# Patient Record
Sex: Female | Born: 2000 | Race: Black or African American | Hispanic: No | Marital: Single | Smoking: Never smoker
Health system: Southern US, Community
[De-identification: ages and names within clinical notes are randomized; demographics above are authoritative.]

---
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
## 2022-01-18 DIAGNOSIS — Z419 Encounter for procedure for purposes other than remedying health state, unspecified: Secondary | ICD-10-CM | POA: Diagnosis not present

## 2022-02-03 IMAGING — DX DG FOOT COMPLETE 3+V*L*
3 series · 3 of 3 positions shown · non-contrast
Comparison: 04/17/2018

CLINICAL DATA: Fell [REDACTED], caught LEFT foot and ankle
underneath unknown object in garage, having pain LEFT foot and ankle

EXAM:
LEFT FOOT - COMPLETE 3+ VIEW

[foot ap]
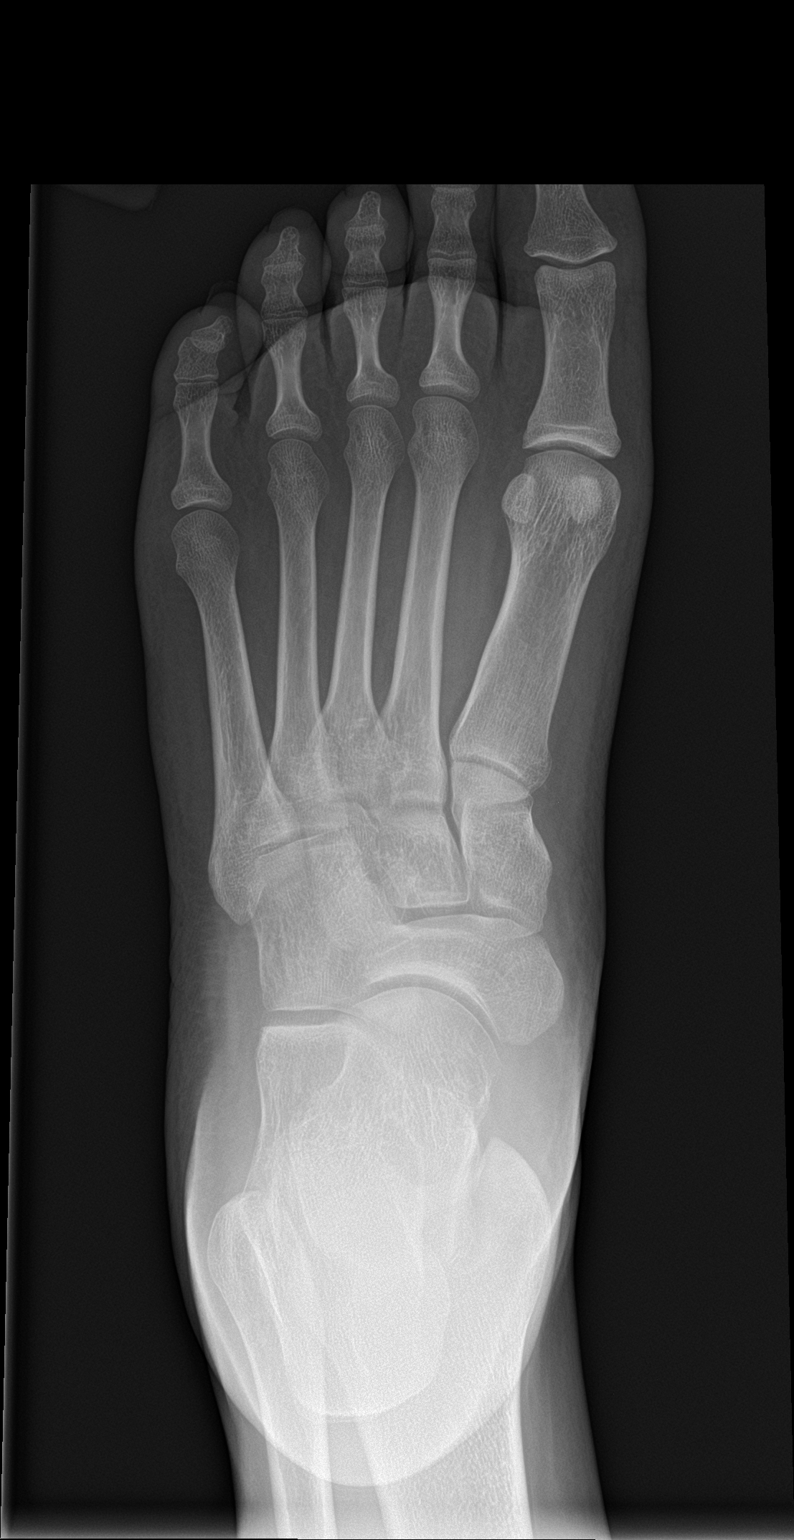

[foot obl]
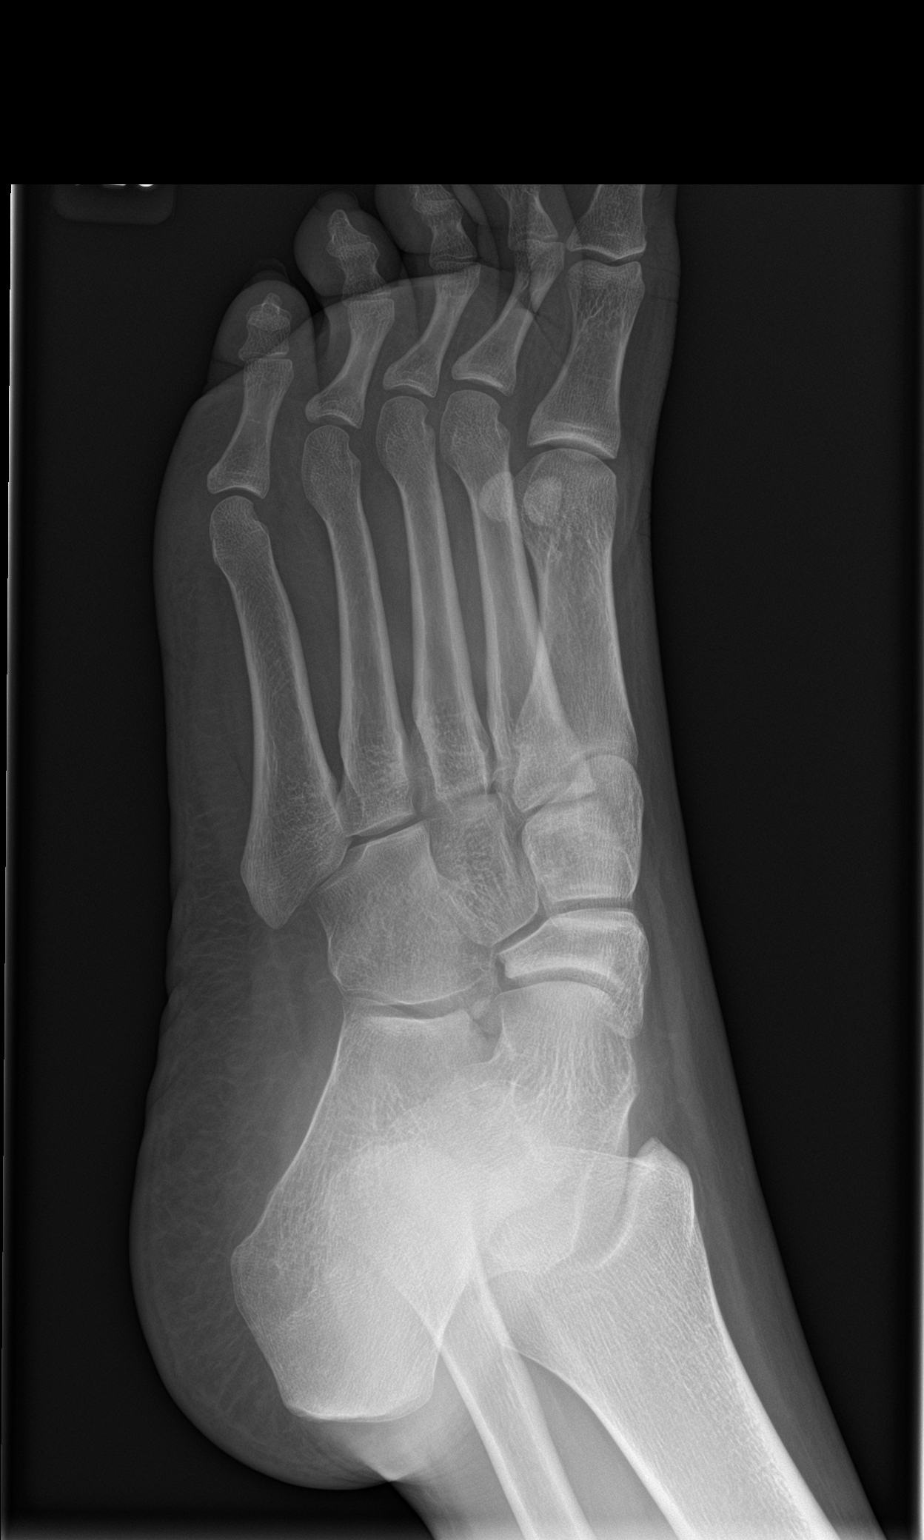

[ankle lat]
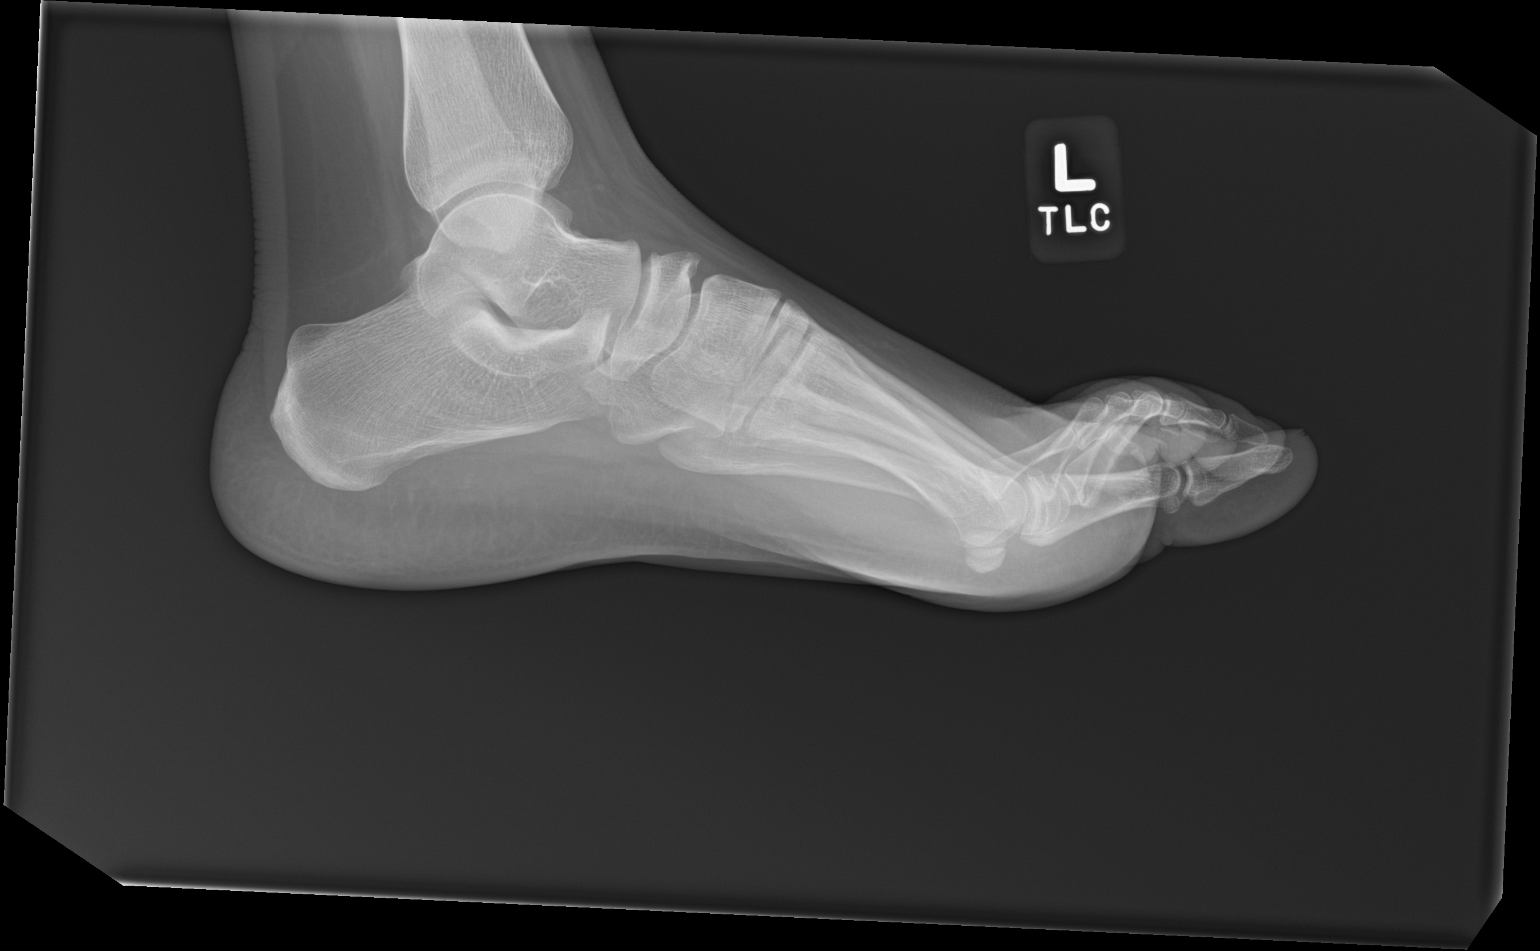

[3 of 3 positions shown; findings below may reference images not displayed]

FINDINGS: Osseous mineralization normal.

Joint spaces preserved.

No fracture, dislocation, or bone destruction.
IMPRESSION: Normal exam.

## 2022-02-03 IMAGING — DX DG ANKLE COMPLETE 3+V*L*
3 series · 3 of 3 positions shown · non-contrast
Comparison: 04/17/2018

CLINICAL DATA: Fell [REDACTED], caught LEFT foot and ankle
underneath unknown object in garage, having pain LEFT foot and ankle

EXAM:
LEFT ANKLE COMPLETE - 3+ VIEW

[ankle ap]
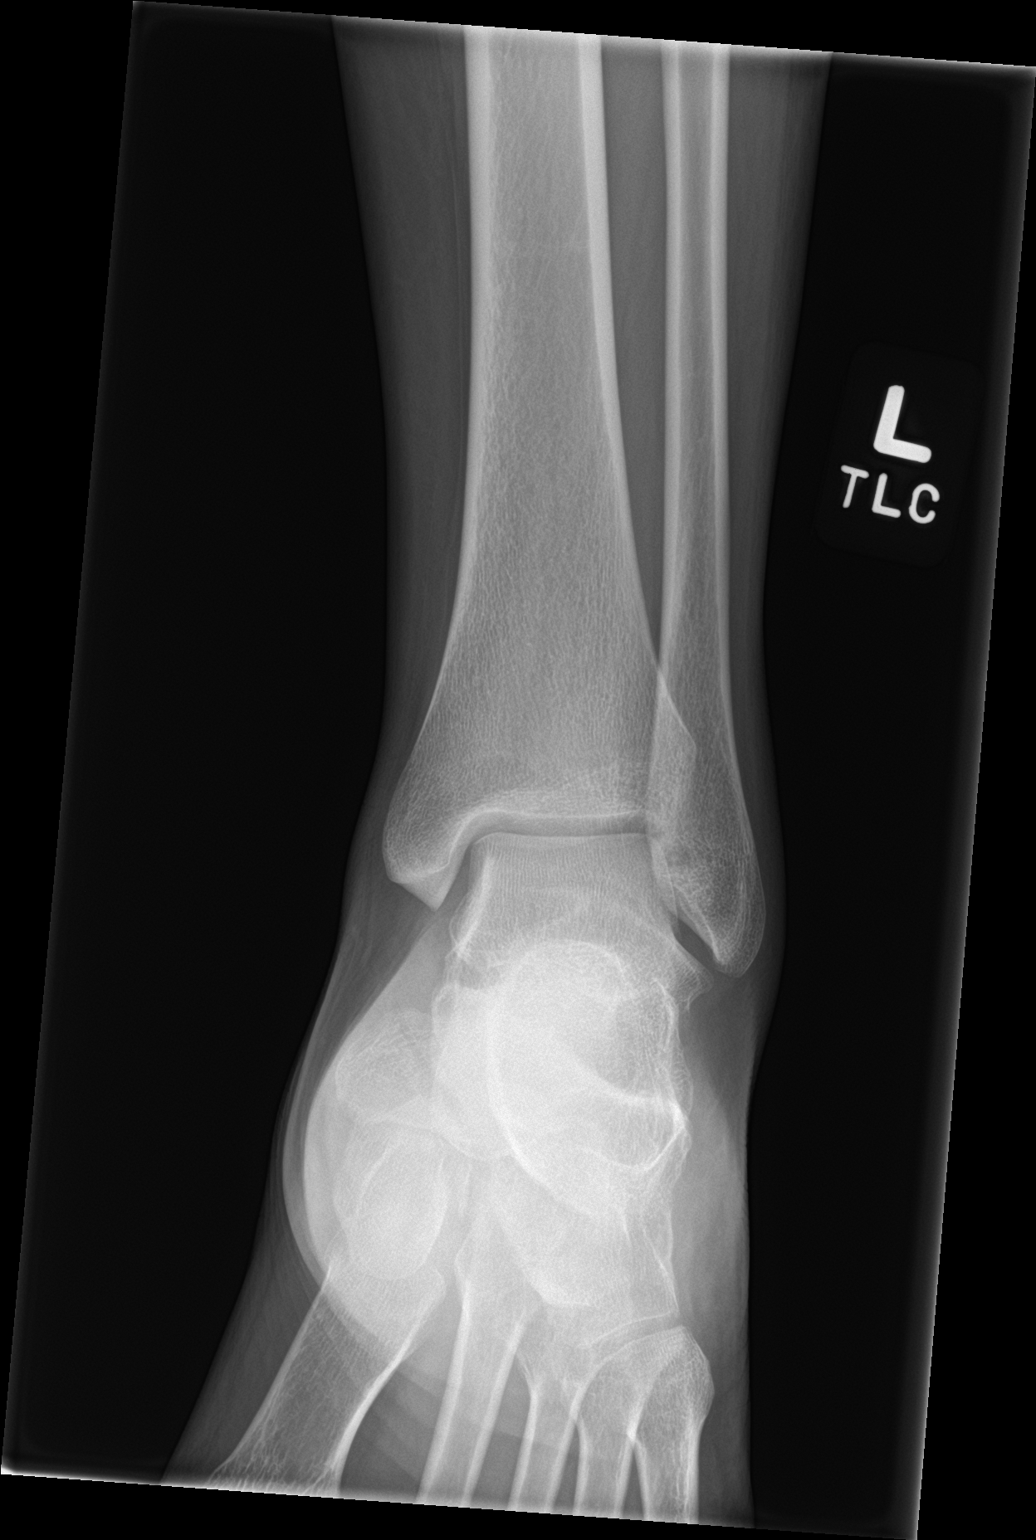

[ankle obl]
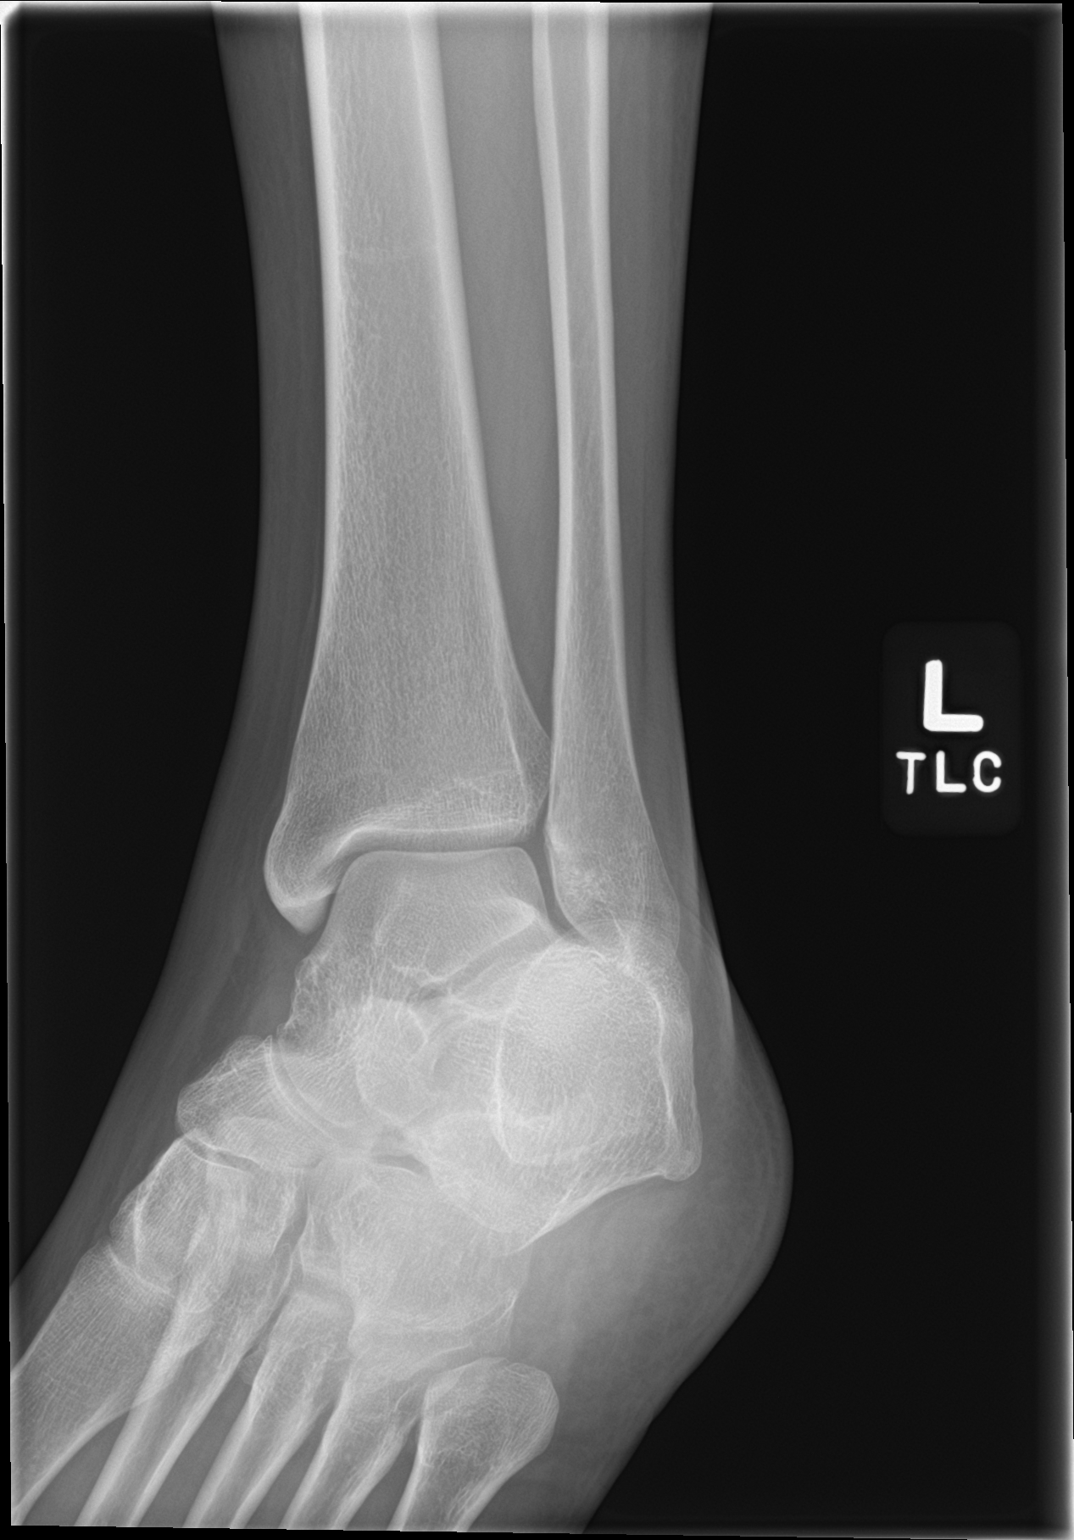

[ankle lat]
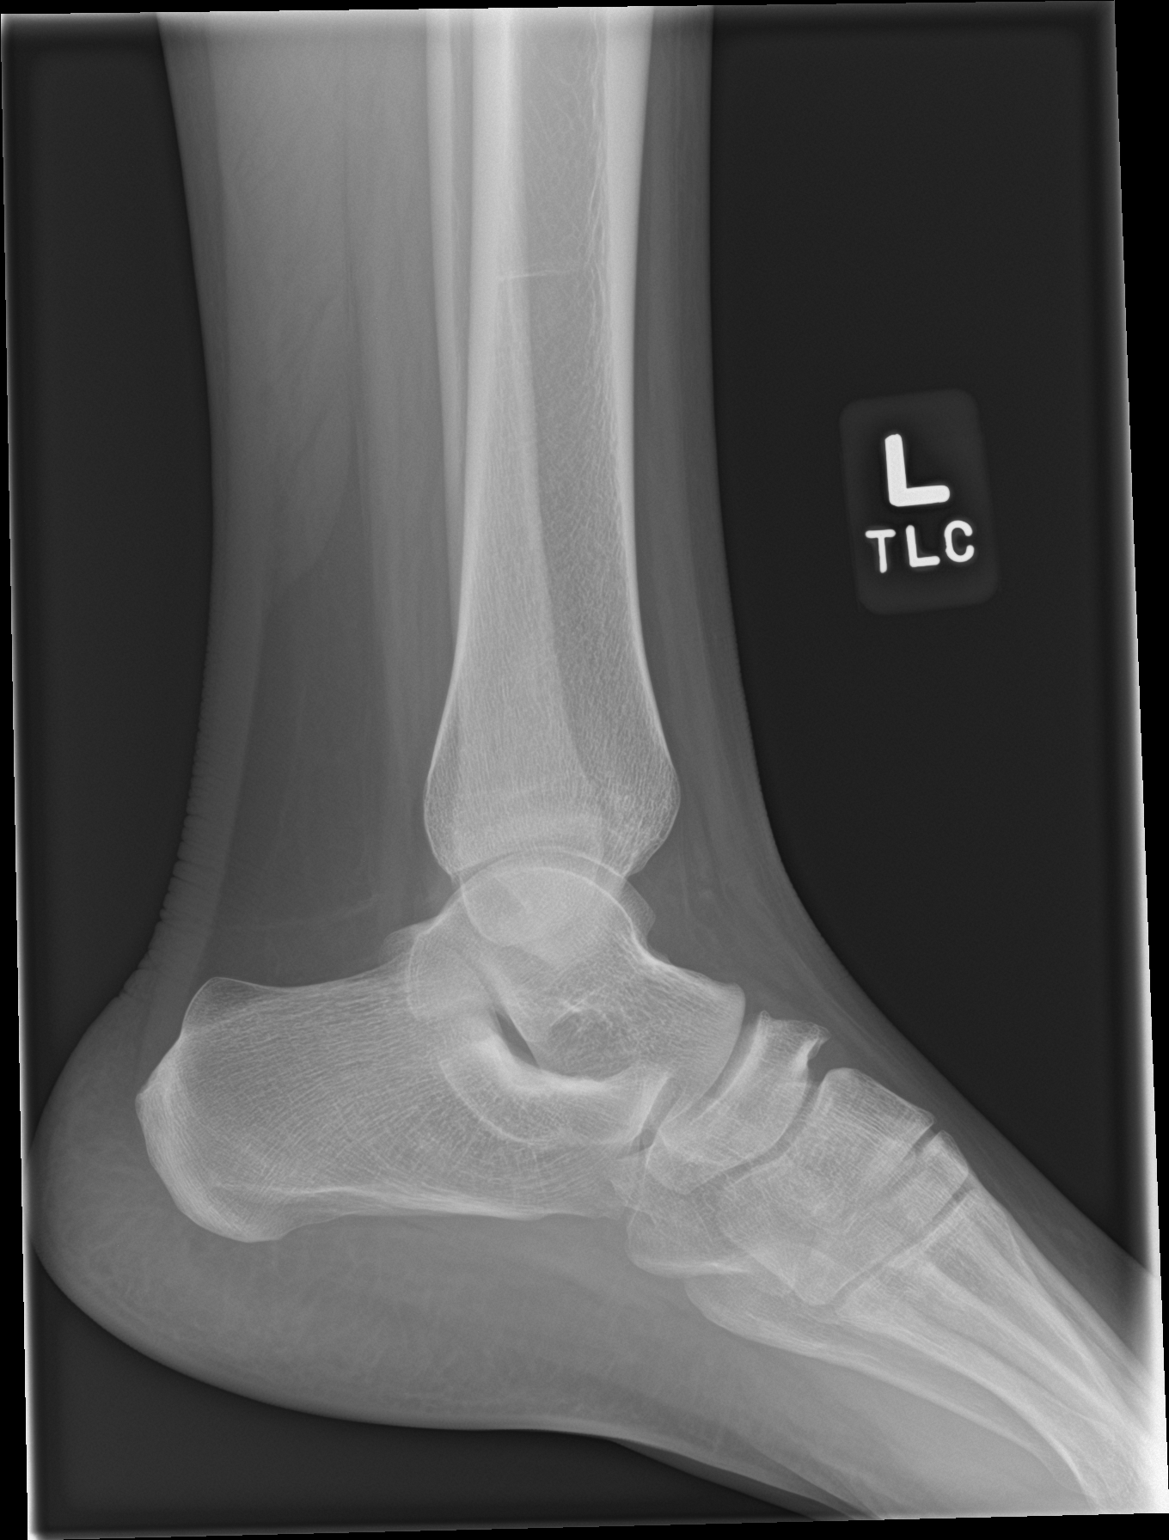

[3 of 3 positions shown; findings below may reference images not displayed]

FINDINGS: Osseous mineralization normal.

Joint spaces preserved.

No fracture, dislocation, or bone destruction.
IMPRESSION: Normal exam.

## 2022-02-17 DIAGNOSIS — Z419 Encounter for procedure for purposes other than remedying health state, unspecified: Secondary | ICD-10-CM | POA: Diagnosis not present

## 2022-03-20 DIAGNOSIS — Z419 Encounter for procedure for purposes other than remedying health state, unspecified: Secondary | ICD-10-CM | POA: Diagnosis not present

## 2022-03-23 DIAGNOSIS — S93601A Unspecified sprain of right foot, initial encounter: Secondary | ICD-10-CM | POA: Diagnosis not present

## 2022-03-23 DIAGNOSIS — X500XXA Overexertion from strenuous movement or load, initial encounter: Secondary | ICD-10-CM | POA: Diagnosis not present

## 2022-03-23 DIAGNOSIS — S93491A Sprain of other ligament of right ankle, initial encounter: Secondary | ICD-10-CM | POA: Diagnosis not present

## 2022-03-23 DIAGNOSIS — J029 Acute pharyngitis, unspecified: Secondary | ICD-10-CM | POA: Diagnosis not present

## 2022-04-19 DIAGNOSIS — Z419 Encounter for procedure for purposes other than remedying health state, unspecified: Secondary | ICD-10-CM | POA: Diagnosis not present

## 2022-05-20 DIAGNOSIS — Z419 Encounter for procedure for purposes other than remedying health state, unspecified: Secondary | ICD-10-CM | POA: Diagnosis not present

## 2022-06-20 DIAGNOSIS — Z419 Encounter for procedure for purposes other than remedying health state, unspecified: Secondary | ICD-10-CM | POA: Diagnosis not present

## 2022-07-20 DIAGNOSIS — Z419 Encounter for procedure for purposes other than remedying health state, unspecified: Secondary | ICD-10-CM | POA: Diagnosis not present

## 2022-08-20 DIAGNOSIS — Z419 Encounter for procedure for purposes other than remedying health state, unspecified: Secondary | ICD-10-CM | POA: Diagnosis not present

## 2022-09-19 DIAGNOSIS — Z419 Encounter for procedure for purposes other than remedying health state, unspecified: Secondary | ICD-10-CM | POA: Diagnosis not present

## 2022-10-20 DIAGNOSIS — Z419 Encounter for procedure for purposes other than remedying health state, unspecified: Secondary | ICD-10-CM | POA: Diagnosis not present

## 2022-11-20 DIAGNOSIS — Z419 Encounter for procedure for purposes other than remedying health state, unspecified: Secondary | ICD-10-CM | POA: Diagnosis not present

## 2022-11-29 ENCOUNTER — Other Ambulatory Visit: Payer: Self-pay

## 2022-11-29 ENCOUNTER — Emergency Department (HOSPITAL_BASED_OUTPATIENT_CLINIC_OR_DEPARTMENT_OTHER): Payer: Medicaid Other

## 2022-11-29 ENCOUNTER — Emergency Department (HOSPITAL_BASED_OUTPATIENT_CLINIC_OR_DEPARTMENT_OTHER)
Admission: EM | Admit: 2022-11-29 | Discharge: 2022-11-29 | Disposition: A | Discharge status: Home / Self Care | Payer: Medicaid Other | Attending: Emergency Medicine | Admitting: Emergency Medicine

## 2022-11-29 DIAGNOSIS — D72829 Elevated white blood cell count, unspecified: Secondary | ICD-10-CM | POA: Diagnosis not present

## 2022-11-29 DIAGNOSIS — Y9301 Activity, walking, marching and hiking: Secondary | ICD-10-CM | POA: Diagnosis not present

## 2022-11-29 DIAGNOSIS — Z743 Need for continuous supervision: Secondary | ICD-10-CM | POA: Diagnosis not present

## 2022-11-29 DIAGNOSIS — E860 Dehydration: Secondary | ICD-10-CM

## 2022-11-29 DIAGNOSIS — R55 Syncope and collapse: Secondary | ICD-10-CM

## 2022-11-29 DIAGNOSIS — W01198A Fall on same level from slipping, tripping and stumbling with subsequent striking against other object, initial encounter: Secondary | ICD-10-CM | POA: Diagnosis not present

## 2022-11-29 DIAGNOSIS — G4489 Other headache syndrome: Secondary | ICD-10-CM | POA: Diagnosis not present

## 2022-11-29 DIAGNOSIS — R531 Weakness: Secondary | ICD-10-CM | POA: Diagnosis not present

## 2022-11-29 DIAGNOSIS — R0902 Hypoxemia: Secondary | ICD-10-CM | POA: Diagnosis not present

## 2022-11-29 DIAGNOSIS — S0990XA Unspecified injury of head, initial encounter: Secondary | ICD-10-CM

## 2022-11-29 LAB — URINALYSIS, ROUTINE W REFLEX MICROSCOPIC
Bacteria, UA: NONE SEEN
Bilirubin Urine: NEGATIVE
Glucose, UA: NEGATIVE mg/dL
Hgb urine dipstick: NEGATIVE
Ketones, ur: NEGATIVE mg/dL
Nitrite: NEGATIVE
Specific Gravity, Urine: 1.022 (ref 1.005–1.030)
WBC, UA: >50 WBC/hpf (ref 0–5)
pH: 6 (ref 5.0–8.0)

## 2022-11-29 LAB — CBC
HCT: 39.4 % (ref 36.0–46.0)
Hemoglobin: 12.1 g/dL (ref 12.0–15.0)
MCH: 21.6 pg — ABNORMAL LOW (ref 26.0–34.0)
MCHC: 30.7 g/dL (ref 30.0–36.0)
MCV: 70.4 fL — ABNORMAL LOW (ref 80.0–100.0)
Platelets: 412 10*3/uL — ABNORMAL HIGH (ref 150–400)
RBC: 5.6 MIL/uL — ABNORMAL HIGH (ref 3.87–5.11)
RDW: 16 % — ABNORMAL HIGH (ref 11.5–15.5)
WBC: 18.9 10*3/uL — ABNORMAL HIGH (ref 4.0–10.5)
nRBC: 0 % (ref 0.0–0.2)

## 2022-11-29 LAB — BASIC METABOLIC PANEL
Anion gap: 9 (ref 5–15)
BUN: 8 mg/dL (ref 6–20)
CO2: 23 mmol/L (ref 22–32)
Calcium: 8.7 mg/dL — ABNORMAL LOW (ref 8.9–10.3)
Chloride: 107 mmol/L (ref 98–111)
Creatinine, Ser: 0.8 mg/dL (ref 0.44–1.00)
GFR, Estimated: >60 mL/min (ref >60)
Glucose, Bld: 105 mg/dL — ABNORMAL HIGH (ref 70–99)
Potassium: 3.6 mmol/L (ref 3.5–5.1)
Sodium: 139 mmol/L (ref 135–145)

## 2022-11-29 LAB — PREGNANCY, URINE: Preg Test, Ur: NEGATIVE

## 2022-11-29 LAB — CBG MONITORING, ED: Glucose-Capillary: 123 mg/dL — ABNORMAL HIGH (ref 70–99)

## 2022-11-29 MED ORDER — SODIUM CHLORIDE 0.9 % IV BOLUS
1000.0000 mL | Freq: Once | INTRAVENOUS | Status: AC
  Administered 2022-11-29: 1000 mL via INTRAVENOUS

## 2022-11-29 MED ORDER — SODIUM CHLORIDE 0.9 % IV SOLN: 1.0000 g | Freq: Once | INTRAVENOUS | Status: DC

## 2022-11-29 MED ORDER — ACETAMINOPHEN 325 MG PO TABS
650.0000 mg | ORAL_TABLET | Freq: Once | ORAL | Status: AC
  Administered 2022-11-29: 650 mg via ORAL
  Filled 2022-11-29: qty 2

## 2022-11-29 NOTE — ED Provider Notes (Signed)
[place] Provider Note   CSN: [id number] Arrival date & time: 11/29/22  1620     History  Chief Complaint  Patient presents with   Loss of Consciousness    Kristin Calderon is a 22 y.o. female with noncontributory past medical history who presents with concern for syncopal episode 20 minutes after donating plasma.  Patient reports that she was walking on the street shortly after donating, started to feel hot, dizzy.  Patient reports that she did hit her head on the right frontal region.  She reports no significant ongoing headache, no numbness, tingling, vision changes.  Patient denies any recent blood loss other than plasma donation.  She denies any dysuria, urinary frequency, chest pain, shortness of breath, headache prior to syncope.  She reports no prior episodes of similar.  She denies any history of early cardiac death in her family.  Loss of Consciousness      Home Medications Prior to Admission medications   Medication Sig Start Date End Date Taking? Authorizing Provider  ondansetron (ZOFRAN) 4 MG tablet Take 1 tablet (4 mg total) by mouth every 8 (eight) hours as needed for nausea or vomiting. 02/03/17   Archer Asa, NP      Allergies    Patient has no known allergies.    Review of Systems   Review of Systems  Cardiovascular:  Positive for syncope.  All other systems reviewed and are negative.   Physical Exam Updated Vital Signs BP 120/74 (BP Location: Left Arm)   Pulse 95   Temp 98.7 F (37.1 C) (Oral)   Resp 18   Ht 5' 10"$  (1.778 m)   Wt 108.9 kg   SpO2 100%   BMI 34.44 kg/m  Physical Exam Vitals and nursing note reviewed.  Constitutional:      General: She is not in acute distress.    Appearance: Normal appearance.  HENT:     Head: Normocephalic and atraumatic.  Eyes:     General:        Right eye: No discharge.        Left eye: No discharge.  Cardiovascular:     Rate and Rhythm: Normal rate  and regular rhythm.     Heart sounds: No murmur heard.    No friction rub. No gallop.  Pulmonary:     Effort: Pulmonary effort is normal.     Breath sounds: Normal breath sounds.  Abdominal:     General: Bowel sounds are normal.     Palpations: Abdomen is soft.  Skin:    General: Skin is warm and dry.     Capillary Refill: Capillary refill takes less than 2 seconds.     Comments: Patient with no significant abrasion, excoriation, or laceration on the right frontal skull where she struck the ground  Neurological:     Mental Status: She is alert and oriented to person, place, and time.     Comments: Cranial nerves II through XII grossly intact.  Intact finger-nose, intact heel-to-shin.  Romberg negative, gait normal.  Alert and oriented x3.  Moves all 4 limbs spontaneously, normal coordination.  No pronator drift.  Intact strength 5 out of 5 bilateral upper and lower extremities.  Mild sensitivity to light on evaluation pupils, but normal pupillary response  Psychiatric:        Mood and Affect: Mood normal.        Behavior: Behavior normal.     ED Results / Procedures /  Treatments   Labs (all labs ordered are listed, but only abnormal results are displayed) Labs Reviewed  BASIC METABOLIC PANEL - Abnormal; Notable for the following components:      Result Value   Glucose, Bld 105 (*)    Calcium 8.7 (*)    All other components within normal limits  CBC - Abnormal; Notable for the following components:   WBC 18.9 (*)    RBC 5.60 (*)    MCV 70.4 (*)    MCH 21.6 (*)    RDW 16.0 (*)    Platelets 412 (*)    All other components within normal limits  URINALYSIS, ROUTINE W REFLEX MICROSCOPIC - Abnormal; Notable for the following components:   APPearance HAZY (*)    Protein, ur TRACE (*)    Leukocytes,Ua LARGE (*)    All other components within normal limits  CBG MONITORING, ED - Abnormal; Notable for the following components:   Glucose-Capillary 123 (*)    All other components  within normal limits  PREGNANCY, URINE    EKG None  Radiology CT Head Wo Contrast  Result Date: 11/29/2022 CLINICAL DATA:  Head trauma, moderate-severe EXAM: CT HEAD WITHOUT CONTRAST TECHNIQUE: Contiguous axial images were obtained from the base of the skull through the vertex without intravenous contrast. RADIATION DOSE REDUCTION: This exam was performed according to the departmental dose-optimization program which includes automated exposure control, adjustment of the mA and/or kV according to patient size and/or use of iterative reconstruction technique. COMPARISON:  None Available. FINDINGS: Brain: No evidence of large-territorial acute infarction. No parenchymal hemorrhage. No mass lesion. No extra-axial collection. No mass effect or midline shift. No hydrocephalus. Basilar cisterns are patent. Vascular: No hyperdense vessel. Skull: No acute fracture or focal lesion. Sinuses/Orbits: Paranasal sinuses and mastoid air cells are clear. The orbits are unremarkable. Other: None. IMPRESSION: No acute intracranial abnormality. Electronically Signed   By: Iven Finn M.D.   On: 11/29/2022 20:44    Procedures Procedures    Medications Ordered in ED Medications  sodium chloride 0.9 % bolus 1,000 mL (1,000 mLs Intravenous New Bag/Given 11/29/22 2003)  acetaminophen (TYLENOL) tablet 650 mg (650 mg Oral Given 11/29/22 2047)    ED Course/ Medical Decision Making/ A&P Clinical Course as of 11/29/22 2144  Sat Nov 29, 2022  2008 LOC 20 minutes after donating plasma. Started feeling hot, dizzy. Did hit head but no ongoing headache, no neuro deficits.  [CP]    Clinical Course User Index [CP] Anselmo Pickler, PA-C                             Medical Decision Making Amount and/or Complexity of Data Reviewed Labs: ordered.   This patient is a 22 y.o. female  who presents to the ED for concern of syncope shortly after donating plasma.   Differential diagnoses prior to evaluation: The  emergent differential diagnosis includes, but is not limited to,  CVA, ACS, arrhythmia, vasovagal syncope, orthostatic hypotension, sepsis, hypoglycemia, electrolyte disturbance, respiratory failure, symptomatic anemia, dehydration, heat injury, polypharmacy, malignancy, anxiety/panic attack . This is not an exhaustive differential.   Past Medical History / Co-morbidities: Overall noncontributory  Physical Exam: Physical exam performed. The pertinent findings include: Patient with no signs of significant neurologic deficits, or significant traumatic injury in the right temple where she struck her head.  She is lying in bed in no acute distress on repeat examination.  Lab Tests/Imaging studies: I personally interpreted  labs/imaging and the pertinent results include: Patient does have moderate leukocytosis with white blood cells 18.9, she does have some global hemoconcentration, but no other focal or localizing symptoms to suggest acute infection.  UA does have large leukocytes and greater than 50 white blood cells but no bacteria and large amount of squamous contamination, she is not having any urinary symptoms, overall low clinical suspicion for acute UTI, will not treat.  BMP overall unremarkable other than mild hyperglycemia, glucose 105.  Pregnancy test negative..  Independently interpreted CT head without contrast which shows no evidence of acute intra cranial abnormality. I agree with the radiologist interpretation.  Cardiac monitoring: EKG obtained and interpreted by my attending physician which shows: Normal sinus rhythm   Medications: I ordered medication including fluid bolus for dehydration, Tylenol for headache, patient reports improved after these medications.  I have reviewed the patients home medicines and have made adjustments as needed.   Disposition: After consideration of the diagnostic results and the patients response to treatment, I feel that patient with syncopal episode  likely secondary to recent plasma donation, mild dehydration, I think that she is stable at this time, and feeling improved.   emergency department workup does not suggest an emergent condition requiring admission or immediate intervention beyond what has been performed at this time. The plan is: as above. The patient is safe for discharge and has been instructed to return immediately for worsening symptoms, change in symptoms or any other concerns.  Final Clinical Impression(s) / ED Diagnoses Final diagnoses:  Syncope and collapse  Injury of head, initial encounter  Dehydration    Rx / DC Orders ED Discharge Orders     None         Anselmo Pickler, PA-C 11/29/22 2144    Lorelle Gibbs, DO 11/30/22 0006

## 2022-11-29 NOTE — ED Triage Notes (Signed)
Pt arrived via [facility] for a syncopal episode.. pt stood up and fell down and hit the right side of her head. Pt stated that she was dizzy and hot before passing out. Pt reports nausea with 2 episodes of emesis. Pt currently denis Nausea at this time.   Pt is not on thinners.

## 2022-12-19 DIAGNOSIS — Z419 Encounter for procedure for purposes other than remedying health state, unspecified: Secondary | ICD-10-CM | POA: Diagnosis not present

## 2023-01-19 DIAGNOSIS — Z419 Encounter for procedure for purposes other than remedying health state, unspecified: Secondary | ICD-10-CM | POA: Diagnosis not present

## 2023-02-18 DIAGNOSIS — Z419 Encounter for procedure for purposes other than remedying health state, unspecified: Secondary | ICD-10-CM | POA: Diagnosis not present

## 2023-03-21 DIAGNOSIS — Z419 Encounter for procedure for purposes other than remedying health state, unspecified: Secondary | ICD-10-CM | POA: Diagnosis not present

## 2023-04-20 DIAGNOSIS — Z419 Encounter for procedure for purposes other than remedying health state, unspecified: Secondary | ICD-10-CM | POA: Diagnosis not present

## 2023-05-21 DIAGNOSIS — Z419 Encounter for procedure for purposes other than remedying health state, unspecified: Secondary | ICD-10-CM | POA: Diagnosis not present

## 2023-06-21 DIAGNOSIS — Z419 Encounter for procedure for purposes other than remedying health state, unspecified: Secondary | ICD-10-CM | POA: Diagnosis not present

## 2023-07-21 DIAGNOSIS — Z419 Encounter for procedure for purposes other than remedying health state, unspecified: Secondary | ICD-10-CM | POA: Diagnosis not present

## 2023-08-21 DIAGNOSIS — Z419 Encounter for procedure for purposes other than remedying health state, unspecified: Secondary | ICD-10-CM | POA: Diagnosis not present

## 2023-09-20 DIAGNOSIS — Z419 Encounter for procedure for purposes other than remedying health state, unspecified: Secondary | ICD-10-CM | POA: Diagnosis not present

## 2023-10-21 DIAGNOSIS — Z419 Encounter for procedure for purposes other than remedying health state, unspecified: Secondary | ICD-10-CM | POA: Diagnosis not present

## 2023-11-21 DIAGNOSIS — Z419 Encounter for procedure for purposes other than remedying health state, unspecified: Secondary | ICD-10-CM | POA: Diagnosis not present

## 2023-12-19 DIAGNOSIS — Z419 Encounter for procedure for purposes other than remedying health state, unspecified: Secondary | ICD-10-CM | POA: Diagnosis not present

## 2024-01-13 ENCOUNTER — Other Ambulatory Visit: Payer: Self-pay

## 2024-01-13 ENCOUNTER — Encounter (HOSPITAL_COMMUNITY): Payer: Self-pay | Admitting: *Deleted

## 2024-01-13 ENCOUNTER — Ambulatory Visit (HOSPITAL_COMMUNITY)
Admission: EM | Admit: 2024-01-13 | Discharge: 2024-01-13 | Disposition: A | Discharge status: Home / Self Care | Attending: Emergency Medicine | Admitting: Emergency Medicine

## 2024-01-13 DIAGNOSIS — H66012 Acute suppurative otitis media with spontaneous rupture of ear drum, left ear: Secondary | ICD-10-CM | POA: Diagnosis not present

## 2024-01-13 DIAGNOSIS — H60502 Unspecified acute noninfective otitis externa, left ear: Secondary | ICD-10-CM

## 2024-01-13 MED ORDER — AMOXICILLIN-POT CLAVULANATE 875-125 MG PO TABS: 1.0000 | ORAL_TABLET | Freq: Two times a day (BID) | ORAL | 0 refills | Status: AC

## 2024-01-13 MED ORDER — OFLOXACIN 0.3 % OT SOLN: 5.0000 [drp] | Freq: Two times a day (BID) | OTIC | 0 refills | Status: AC

## 2024-01-13 NOTE — ED Triage Notes (Signed)
 PT reports Lt ear pain since Thursday . Pt reports ear hurts when thre air hits . Pt put ice below the ear todayand has taken OTC .

## 2024-01-13 NOTE — ED Provider Notes (Signed)
 [facility]    CSN: [id number] Arrival date & time: 01/13/24  1651      History   Chief Complaint Chief Complaint  Patient presents with   Otalgia    HPI Kristin Calderon is a 23 y.o. female.   Patient presents with left ear pain that began on 3/20 and has progressively worsened.  Reports that her ear is tender to touch and the pain is now spreading down her neck.  Denies fever, sore throat, cough, and congestion.   Otalgia   Past Medical History:  Diagnosis Date   Asthma     There are no active problems to display for this patient.   History reviewed. No pertinent surgical history.  OB History   No obstetric history on file.      Home Medications    Prior to Admission medications   Medication Sig Start Date End Date Taking? Authorizing Provider  amoxicillin-clavulanate (AUGMENTIN) 875-125 MG tablet Take 1 tablet by mouth every 12 (twelve) hours. 01/13/24  Yes Susann Givens, Ira Busbin A, NP  ofloxacin (FLOXIN) 0.3 % OTIC solution Place 5 drops into the left ear 2 (two) times daily for 7 days. 01/13/24 01/20/24 Yes Letta Kocher, NP    Family History History reviewed. No pertinent family history.  Social History Social History   Tobacco Use   Smoking status: Passive Smoke Exposure - Never Smoker   Smokeless tobacco: Never  Substance Use Topics   Alcohol use: No   Drug use: No     Allergies   Patient has no known allergies.   Review of Systems Review of Systems  HENT:  Positive for ear pain.    Per HPI  Physical Exam Triage Vital Signs ED Triage Vitals  Encounter Vitals Group     BP 01/13/24 1803 128/82     Systolic BP Percentile --      Diastolic BP Percentile --      Pulse Rate 01/13/24 1803 84     Resp 01/13/24 1803 20     Temp 01/13/24 1803 98 F (36.7 C)     Temp src --      SpO2 --      Weight --      Height --      Head Circumference --      Peak Flow --      Pain Score 01/13/24 1801 6     Pain Loc --      Pain  Education --      Exclude from Growth Chart --    No data found.  Updated Vital Signs BP 128/82   Pulse 84   Temp 98 F (36.7 C)   Resp 20   LMP 12/28/2023   Visual Acuity Right Eye Distance:   Left Eye Distance:   Bilateral Distance:    Right Eye Near:   Left Eye Near:    Bilateral Near:     Physical Exam Vitals and nursing note reviewed.  Constitutional:      General: She is awake. She is not in acute distress.    Appearance: Normal appearance. She is well-developed and well-groomed. She is not ill-appearing.  HENT:     Right Ear: Tympanic membrane, ear canal and external ear normal.     Left Ear: Swelling and tenderness present. There is impacted cerumen.  Neurological:     Mental Status: She is alert.  Psychiatric:        Behavior: Behavior is cooperative.  UC Treatments / Results  Labs (all labs ordered are listed, but only abnormal results are displayed) Labs Reviewed - No data to display  EKG   Radiology No results found.  Procedures Procedures (including critical care time)  Medications Ordered in UC Medications - No data to display  Initial Impression / Assessment and Plan / UC Course  I have reviewed the triage vital signs and the nursing notes.  Pertinent labs & imaging results that were available during my care of the patient were reviewed by me and considered in my medical decision making (see chart for details).     Upon assessment tenderness noted to ear canal, external ear, and postauricular region.  Mild swelling and erythema noted to ear canal with impacted cerumen blocking view to TMs.  Upon reassessment after ear irrigation ruptured TM visualized.  Prescribed Augmentin and ofloxacin eardrops for dual coverage for otitis externa and otitis media.  Discussed follow-up and return precautions. Final Clinical Impressions(s) / UC Diagnoses   Final diagnoses:  Acute noninfective otitis externa of left ear, unspecified type   Non-recurrent acute suppurative otitis media of left ear with spontaneous rupture of tympanic membrane     Discharge Instructions      Start taking Augmentin twice daily for 7 days. Use ofloxacin drops twice daily for 7 days. You can alternate between 650 mg of Tylenol and 400 to 600 mg of ibuprofen every 4-6 hours as needed for pain. Return here if symptoms persist or worsen.     ED Prescriptions     Medication Sig Dispense Auth. Provider   amoxicillin-clavulanate (AUGMENTIN) 875-125 MG tablet Take 1 tablet by mouth every 12 (twelve) hours. 14 tablet Susann Givens, Seleste Tallman A, NP   ofloxacin (FLOXIN) 0.3 % OTIC solution Place 5 drops into the left ear 2 (two) times daily for 7 days. 5 mL Wynonia Lawman A, NP      PDMP not reviewed this encounter.   Wynonia Lawman A, NP 01/13/24 1924

## 2024-01-13 NOTE — Discharge Instructions (Signed)
 Start taking Augmentin twice daily for 7 days. Use ofloxacin drops twice daily for 7 days. You can alternate between 650 mg of Tylenol and 400 to 600 mg of ibuprofen every 4-6 hours as needed for pain. Return here if symptoms persist or worsen.

## 2024-01-30 DIAGNOSIS — Z419 Encounter for procedure for purposes other than remedying health state, unspecified: Secondary | ICD-10-CM | POA: Diagnosis not present

## 2024-02-11 DIAGNOSIS — H18603 Keratoconus, unspecified, bilateral: Secondary | ICD-10-CM | POA: Diagnosis not present

## 2024-02-11 DIAGNOSIS — H5310 Unspecified subjective visual disturbances: Secondary | ICD-10-CM | POA: Diagnosis not present

## 2024-02-17 ENCOUNTER — Encounter: Payer: Self-pay | Admitting: Obstetrics and Gynecology

## 2024-02-17 ENCOUNTER — Ambulatory Visit: Payer: Medicaid Other | Admitting: Obstetrics and Gynecology

## 2024-02-17 ENCOUNTER — Other Ambulatory Visit (HOSPITAL_COMMUNITY)
Admission: RE | Admit: 2024-02-17 | Discharge: 2024-02-17 | Disposition: A | Discharge status: Home / Self Care | Source: Ambulatory Visit | Attending: Obstetrics and Gynecology | Admitting: Obstetrics and Gynecology

## 2024-02-17 ENCOUNTER — Other Ambulatory Visit: Payer: Self-pay

## 2024-02-17 VITALS — BP 114/68 | HR 67 | Wt 241.0 lb

## 2024-02-17 DIAGNOSIS — Z113 Encounter for screening for infections with a predominantly sexual mode of transmission: Secondary | ICD-10-CM

## 2024-02-17 DIAGNOSIS — Z01419 Encounter for gynecological examination (general) (routine) without abnormal findings: Secondary | ICD-10-CM | POA: Diagnosis not present

## 2024-02-17 NOTE — Progress Notes (Signed)
   ANNUAL EXAM Patient name: Kristin Calderon MRN [id number]  Date of birth: Mar 07, 2001 Chief Complaint:   Gynecologic Exam  History of Present Illness:   Kristin Calderon is a 23 y.o. No obstetric history on file. being seen today for a routine annual exam.  Current complaints: annual   Menstrual concerns? No   Breast or nipple changes? Yes feels that they are uneven Contraception use? No  Sexually active? Yes female partner  Patient's last menstrual period was 01/28/2024 (approximate).   The pregnancy intention screening data noted above was reviewed. Potential methods of contraception were discussed. The patient elected to proceed with No data recorded.   Last pap No results found for: "DIAGPAP", "HPVHIGH", "ADEQPAP" Last mammogram: n/a.  Last colonoscopy: n/a.       No data to display               No data to display           Review of Systems:   Pertinent items are noted in HPI Denies any headaches, blurred vision, fatigue, shortness of breath, chest pain, abdominal pain, abnormal vaginal discharge/itching/odor/irritation, problems with periods, bowel movements, urination, or intercourse unless otherwise stated above. Pertinent History Reviewed:  Reviewed past medical,surgical, social and family history.  Reviewed problem list, medications and allergies. Physical Assessment:   Vitals:   02/17/24 1048  BP: 114/68  Pulse: 67  Weight: 241 lb (109.3 kg)  Body mass index is 34.58 kg/m.        Physical Examination:   General appearance - well appearing, and in no distress  Mental status - alert, oriented to person, place, and time  Psych:  She has a normal mood and affect  Skin - warm and dry, normal color, no suspicious lesions noted  Chest - effort normal, all lung fields clear to auscultation bilaterally  Heart - normal rate and regular rhythm  Breasts - breasts appear normal, no suspicious masses, no skin or nipple changes or  axillary nodes  Abdomen  - soft, nontender, nondistended, no masses or organomegaly  Pelvic -  VULVA: normal appearing vulva with no masses, tenderness or lesions    ADNEXA: No adnexal masses or tenderness noted.  Extremities:  No swelling or varicosities noted  Chaperone present for exam  No results found for this or any previous visit (from the past 24 hours).    Assessment & Plan:  1. Screening examination for STI - RPR+HBsAg+HCVAb+... - Cervicovaginal ancillary only  2. Well woman exam with routine gynecological exam (Primary) Pap starting at 21 years old Recommend HPV if not previously administered to decrease risk of transmission of HPV  Orders Placed This Encounter  Procedures   RPR+HBsAg+HCVAb+...    Meds: No orders of the defined types were placed in this encounter.   Follow-up: No follow-ups on file.  Kiki Pelton, MD 02/17/2024 10:53 AM

## 2024-02-18 ENCOUNTER — Encounter: Payer: Self-pay | Admitting: Obstetrics and Gynecology

## 2024-02-18 ENCOUNTER — Other Ambulatory Visit: Payer: Self-pay | Admitting: Obstetrics and Gynecology

## 2024-02-18 LAB — RPR+HBSAG+HCVAB+...
HIV Screen 4th Generation wRfx: NONREACTIVE
Hep C Virus Ab: NONREACTIVE
Hepatitis B Surface Ag: NEGATIVE
RPR Ser Ql: NONREACTIVE

## 2024-02-18 LAB — CERVICOVAGINAL ANCILLARY ONLY
Chlamydia: POSITIVE — AB
Comment: NEGATIVE
Comment: NEGATIVE
Comment: NORMAL
Neisseria Gonorrhea: NEGATIVE
Trichomonas: POSITIVE — AB

## 2024-02-19 ENCOUNTER — Telehealth: Payer: Self-pay | Admitting: *Deleted

## 2024-02-19 ENCOUNTER — Encounter: Payer: Self-pay | Admitting: *Deleted

## 2024-02-19 DIAGNOSIS — A599 Trichomoniasis, unspecified: Secondary | ICD-10-CM

## 2024-02-19 DIAGNOSIS — A749 Chlamydial infection, unspecified: Secondary | ICD-10-CM

## 2024-02-19 MED ORDER — METRONIDAZOLE 500 MG PO TABS: 500.0000 mg | ORAL_TABLET | Freq: Two times a day (BID) | ORAL | 0 refills | Status: AC

## 2024-02-19 MED ORDER — DOXYCYCLINE HYCLATE 100 MG PO TABS: 100.0000 mg | ORAL_TABLET | Freq: Two times a day (BID) | ORAL | 0 refills | Status: AC

## 2024-02-19 NOTE — Telephone Encounter (Signed)
 Received Infection report showing Kristin Calderon tested positive for Chlamydia. Per chart review also tested positive for Trichomonas. I called patient and left a message I am trying to reach her re: results and prescriptions and since I did not reach you I will send a detailed MyChart message. Please read and if you have questions, respond to message or call the office. RX sent to pharmacy  for Doxycycline for Chlamydia and Flagyl for Trichomonas. Report for [facility] department completed.  Alejandra Hurst

## 2024-02-22 DIAGNOSIS — H0102A Squamous blepharitis right eye, upper and lower eyelids: Secondary | ICD-10-CM | POA: Diagnosis not present

## 2024-02-22 DIAGNOSIS — H0102B Squamous blepharitis left eye, upper and lower eyelids: Secondary | ICD-10-CM | POA: Diagnosis not present

## 2024-02-22 DIAGNOSIS — H18603 Keratoconus, unspecified, bilateral: Secondary | ICD-10-CM | POA: Diagnosis not present

## 2024-02-22 DIAGNOSIS — H1045 Other chronic allergic conjunctivitis: Secondary | ICD-10-CM | POA: Diagnosis not present

## 2024-02-22 DIAGNOSIS — H52213 Irregular astigmatism, bilateral: Secondary | ICD-10-CM | POA: Diagnosis not present

## 2024-02-29 DIAGNOSIS — Z419 Encounter for procedure for purposes other than remedying health state, unspecified: Secondary | ICD-10-CM | POA: Diagnosis not present

## 2024-03-31 DIAGNOSIS — Z419 Encounter for procedure for purposes other than remedying health state, unspecified: Secondary | ICD-10-CM | POA: Diagnosis not present

## 2024-05-18 ENCOUNTER — Ambulatory Visit

## 2024-05-18 ENCOUNTER — Other Ambulatory Visit: Payer: Self-pay

## 2024-05-18 ENCOUNTER — Other Ambulatory Visit (HOSPITAL_COMMUNITY)
Admission: RE | Admit: 2024-05-18 | Discharge: 2024-05-18 | Disposition: A | Discharge status: Home / Self Care | Source: Ambulatory Visit | Attending: Family Medicine | Admitting: Family Medicine

## 2024-05-18 VITALS — BP 121/75 | HR 75 | Ht 70.0 in | Wt 241.0 lb

## 2024-05-18 DIAGNOSIS — Z202 Contact with and (suspected) exposure to infections with a predominantly sexual mode of transmission: Secondary | ICD-10-CM | POA: Diagnosis present

## 2024-05-18 DIAGNOSIS — A749 Chlamydial infection, unspecified: Secondary | ICD-10-CM

## 2024-05-18 NOTE — Progress Notes (Signed)
 Pt here today for TOC after testing positive for STI.  Pt explained how to obtain self swab and that we will call her with abnormal results.  Pt verbalized understanding.   Hendricks Schwandt,RN 05/18/24

## 2024-05-19 LAB — CERVICOVAGINAL ANCILLARY ONLY
Bacterial Vaginitis (gardnerella): POSITIVE — AB
Candida Glabrata: NEGATIVE
Candida Vaginitis: POSITIVE — AB
Chlamydia: NEGATIVE
Comment: NEGATIVE
Comment: NEGATIVE
Comment: NEGATIVE
Comment: NEGATIVE
Comment: NEGATIVE
Comment: NORMAL
Neisseria Gonorrhea: NEGATIVE
Trichomonas: NEGATIVE

## 2024-05-23 ENCOUNTER — Ambulatory Visit: Payer: Self-pay | Admitting: Family Medicine

## 2024-05-23 MED ORDER — FLUCONAZOLE 150 MG PO TABS: 150.0000 mg | ORAL_TABLET | Freq: Once | ORAL | 0 refills | Status: AC

## 2024-05-23 MED ORDER — METRONIDAZOLE 500 MG PO TABS: 500.0000 mg | ORAL_TABLET | Freq: Two times a day (BID) | ORAL | 0 refills | Status: AC
